# Patient Record
Sex: Female | Born: 1975 | Race: White | Hispanic: No | Marital: Married | Smoking: Never smoker
Health system: Southern US, Community
[De-identification: ages and names within clinical notes are randomized; demographics above are authoritative.]

---

## 2019-04-14 ENCOUNTER — Other Ambulatory Visit: Payer: Self-pay

## 2019-04-14 ENCOUNTER — Emergency Department (HOSPITAL_COMMUNITY)
Admission: EM | Admit: 2019-04-14 | Discharge: 2019-04-15 | Disposition: A | Discharge status: Home / Self Care | Payer: BC Managed Care – PPO | Attending: Emergency Medicine | Admitting: Emergency Medicine

## 2019-04-14 ENCOUNTER — Encounter (HOSPITAL_COMMUNITY): Payer: Self-pay | Admitting: Emergency Medicine

## 2019-04-14 DIAGNOSIS — R109 Unspecified abdominal pain: Secondary | ICD-10-CM

## 2019-04-14 DIAGNOSIS — R1032 Left lower quadrant pain: Secondary | ICD-10-CM | POA: Insufficient documentation

## 2019-04-14 LAB — CBC WITH DIFFERENTIAL/PLATELET
Abs Immature Granulocytes: 0.02 10*3/uL (ref 0.00–0.07)
Basophils Absolute: 0.1 10*3/uL (ref 0.0–0.1)
Basophils Relative: 1 %
Eosinophils Absolute: 0.1 10*3/uL (ref 0.0–0.5)
Eosinophils Relative: 2 %
HCT: 40.1 % (ref 36.0–46.0)
Hemoglobin: 13.5 g/dL (ref 12.0–15.0)
Immature Granulocytes: 0 %
Lymphocytes Relative: 21 %
Lymphs Abs: 1.9 10*3/uL (ref 0.7–4.0)
MCH: 32.3 pg (ref 26.0–34.0)
MCHC: 33.7 g/dL (ref 30.0–36.0)
MCV: 95.9 fL (ref 80.0–100.0)
Monocytes Absolute: 0.6 10*3/uL (ref 0.1–1.0)
Monocytes Relative: 7 %
Neutro Abs: 6.4 10*3/uL (ref 1.7–7.7)
Neutrophils Relative %: 69 %
Platelets: 326 10*3/uL (ref 150–400)
RBC: 4.18 MIL/uL (ref 3.87–5.11)
RDW: 11.6 % (ref 11.5–15.5)
WBC: 9.1 10*3/uL (ref 4.0–10.5)
nRBC: 0 % (ref 0.0–0.2)

## 2019-04-14 LAB — BASIC METABOLIC PANEL
Anion gap: 11 (ref 5–15)
BUN: 12 mg/dL (ref 6–20)
CO2: 22 mmol/L (ref 22–32)
Calcium: 9.1 mg/dL (ref 8.9–10.3)
Chloride: 104 mmol/L (ref 98–111)
Creatinine, Ser: 1.05 mg/dL — ABNORMAL HIGH (ref 0.44–1.00)
GFR calc Af Amer: >60 mL/min (ref >60)
GFR calc non Af Amer: >60 mL/min (ref >60)
Glucose, Bld: 237 mg/dL — ABNORMAL HIGH (ref 70–99)
Potassium: 3.7 mmol/L (ref 3.5–5.1)
Sodium: 137 mmol/L (ref 135–145)

## 2019-04-14 LAB — I-STAT BETA HCG BLOOD, ED (MC, WL, AP ONLY): I-stat hCG, quantitative: <5 m[IU]/mL (ref <5)

## 2019-04-14 NOTE — ED Triage Notes (Signed)
Patient reports left back pain for 2 weeks , denies injury , no hematuria or dysuria . Pain increases with movement . No fever or chills .

## 2019-04-15 ENCOUNTER — Emergency Department (HOSPITAL_COMMUNITY): Payer: BC Managed Care – PPO

## 2019-04-15 LAB — URINALYSIS, ROUTINE W REFLEX MICROSCOPIC
Bilirubin Urine: NEGATIVE
Glucose, UA: 50 mg/dL — AB
Ketones, ur: NEGATIVE mg/dL
Nitrite: NEGATIVE
Protein, ur: NEGATIVE mg/dL
Specific Gravity, Urine: 1.019 (ref 1.005–1.030)
pH: 5 (ref 5.0–8.0)

## 2019-04-15 MED ORDER — METHOCARBAMOL 500 MG PO TABS: 500.0000 mg | ORAL_TABLET | Freq: Two times a day (BID) | ORAL | 0 refills | Status: AC

## 2019-04-15 MED ORDER — DOCUSATE SODIUM 100 MG PO CAPS: 100.0000 mg | ORAL_CAPSULE | Freq: Two times a day (BID) | ORAL | 0 refills | Status: AC

## 2019-04-15 NOTE — ED Provider Notes (Signed)
[facility] Provider Note   CSN: [id number] Arrival date & time: 04/14/19  1953     History   Chief Complaint Chief Complaint  Patient presents with  . Back Pain    HPI Tammy Bradshaw is a 43 y.o. female.  43 year old female presenting to the ED with left-sided flank pain.  States this has been a nagging pain for about 2 weeks.  Pain localized to the left flank but does seem to radiate a little bit to her left upper abdomen.  She denies any known injury, trauma, or falls.  Pain is worse with movement.  She has not had any chest pain or shortness of breath.  He denies any dysuria or hematuria but does report urine has had a foul odor.  She denies any history of frequent UTIs.  Also reports her menstrual cycle is 2 weeks late.  She is s/p tubal ligation, not currently on OCP's and husband has had hysterectomy.  She has not tried any medications at home for her symptoms.  She does report history of sciatic disease in the past but this feels very different.  Has not had any numbness or weakness of her legs.  No bowel or bladder incontinence.  The history is provided by the patient and medical records.  Back Pain      History reviewed. No pertinent past medical history.  There are no active problems to display for this patient.   History reviewed. No pertinent surgical history.   OB History   No obstetric history on file.      Home Medications    Prior to Admission medications   Not on File    Family History No family history on file.  Social History Social History   Tobacco Use  . Smoking status: Never Smoker  . Smokeless tobacco: Never Used  Substance Use Topics  . Alcohol use: Yes  . Drug use: Never     Allergies   Patient has no known allergies.   Review of Systems Review of Systems  Musculoskeletal: Positive for back pain.  All other systems reviewed and are negative.    Physical Exam Updated Vital Signs BP  118/70 (BP Location: Right Arm)   Pulse 70   Temp 98.4 F (36.9 C) (Oral)   Resp 16   LMP 02/20/2019 (LMP Unknown)   SpO2 99%   Physical Exam Vitals signs and nursing note reviewed.  Constitutional:      Appearance: She is well-developed.  HENT:     Head: Normocephalic and atraumatic.  Eyes:     Conjunctiva/sclera: Conjunctivae normal.     Pupils: Pupils are equal, round, and reactive to light.  Neck:     Musculoskeletal: Normal range of motion.  Cardiovascular:     Rate and Rhythm: Normal rate and regular rhythm.     Heart sounds: Normal heart sounds.  Pulmonary:     Effort: Pulmonary effort is normal.     Breath sounds: Normal breath sounds.  Abdominal:     General: Bowel sounds are normal.     Palpations: Abdomen is soft.     Tenderness: There is left CVA tenderness.     Comments: Left CVA tenderness that tracks into the left lower thoracic musculature, no deformities or bruising noted, no rash  Musculoskeletal: Normal range of motion.     Comments: Normal strength and sensation in both legs, normal gait  Skin:    General: Skin is warm and dry.  Neurological:  Mental Status: She is alert and oriented to person, place, and time.      ED Treatments / Results  Labs (all labs ordered are listed, but only abnormal results are displayed) Labs Reviewed  BASIC METABOLIC PANEL - Abnormal; Notable for the following components:      Result Value   Glucose, Bld 237 (*)    Creatinine, Ser 1.05 (*)    All other components within normal limits  URINALYSIS, ROUTINE W REFLEX MICROSCOPIC - Abnormal; Notable for the following components:   Glucose, UA 50 (*)    Hgb urine dipstick SMALL (*)    Leukocytes,Ua MODERATE (*)    Bacteria, UA RARE (*)    All other components within normal limits  CBC WITH DIFFERENTIAL/PLATELET  I-STAT BETA HCG BLOOD, ED ([facility], [facility], [facility] ONLY)    EKG None  Radiology Ct Renal Stone Study  Result Date: 04/15/2019 CLINICAL DATA:  Flank pain.  Left-sided flank pain. Back pain x2 weeks. EXAM: CT ABDOMEN AND PELVIS WITHOUT CONTRAST TECHNIQUE: Multidetector CT imaging of the abdomen and pelvis was performed following the standard protocol without IV contrast. COMPARISON:  None. FINDINGS: Lower chest: The lung bases are clear. The heart size is normal. Hepatobiliary: The liver is normal. Normal gallbladder.There is no biliary ductal dilation. Pancreas: Normal contours without ductal dilatation. No peripancreatic fluid collection. Spleen: No splenic laceration or hematoma. Adrenals/Urinary Tract: --Adrenal glands: No adrenal hemorrhage. --Right kidney/ureter: No hydronephrosis or perinephric hematoma. --Left kidney/ureter: No hydronephrosis or perinephric hematoma. --Urinary bladder: Unremarkable. Stomach/Bowel: --Stomach/Duodenum: No hiatal hernia or other gastric abnormality. Normal duodenal course and caliber. --Small bowel: No dilatation or inflammation. --Colon: There is a moderate to large amount of stool throughout the colon. --Appendix: Normal. Vascular/Lymphatic: Normal course and caliber of the major abdominal vessels. --No retroperitoneal lymphadenopathy. --No mesenteric lymphadenopathy. --No pelvic or inguinal lymphadenopathy. Reproductive: Unremarkable Other: No ascites or free air. The abdominal wall is normal. Musculoskeletal. No acute displaced fractures. IMPRESSION: 1. No acute abdominopelvic abnormality. 2. Moderate to large amount of stool throughout the colon. Electronically Signed   By: Katherine Mantle M.D.   On: 04/15/2019 05:42    Procedures Procedures (including critical care time)  Medications Ordered in ED Medications - No data to display   Initial Impression / Assessment and Plan / ED Course  I have reviewed the triage vital signs and the nursing notes.  Pertinent labs & imaging results that were available during my care of the patient were reviewed by me and considered in my medical decision making (see chart for  details).  43 y.o. female presenting to the ED with left flank pain.  This has been ongoing for about 2 weeks.  Also reports some foul-smelling urine but denies any dysuria or hematuria.  She is afebrile and nontoxic.  Does have some pain of the left CVA region that seems to track into the left lower thoracic paraspinal musculature.  This is reproducible.  No focal deficits, ambulatory with steady gait.  She is not having any chest pain, shortness of breath.  No tachycardia or hypoxia to suggest PE and no risk factors for such.  Labs overall reassuring.  UA does have moderate leuks and blood noted but rare bacteria, 0-5 white cells.  Patient will be sent for CT renal study.  Renal stone study without any acute findings.  She does have moderate to large stool burden.  Patient reports normal frequency of bowel movements, but may be smaller in caliber than normal.  Question if she  is experiencing referred pain from her abdomen versus musculoskeletal.  Will start on muscle relaxer and advised to use Colace to see if this will help.  She will need close follow-up with her primary care doctor.  Return here for any new or acute changes.  Final Clinical Impressions(s) / ED Diagnoses   Final diagnoses:  Left flank pain    ED Discharge Orders         Ordered    methocarbamol (ROBAXIN) 500 MG tablet  2 times daily     04/15/19 0625    docusate sodium (COLACE) 100 MG capsule  Every 12 hours     04/15/19 0625           Garlon HatchetSanders, Lisa M, PA-C 04/15/19 [id number]    Nira Connardama, Pedro Eduardo, MD 04/15/19 [phone]

## 2019-04-15 NOTE — ED Notes (Signed)
Patient transported to CT 

## 2019-04-15 NOTE — Discharge Instructions (Signed)
CT scan today did show some stool burden, may want to try a stool softener. Can try taking robaxin to see if it will help back, heating pad, warm soaks, etc may help as well. Follow-up with your primary care doctor. Return here for any new/acute changes.

## 2019-04-15 NOTE — ED Notes (Signed)
ED Provider at bedside. 

## 2020-07-24 IMAGING — CT CT RENAL STONE PROTOCOL
2 of 4 series · 16 of 46 positions shown, 18 images · non-contrast
Comparison: None.

CLINICAL DATA: Flank pain. Left-sided flank pain. Back pain x2
weeks.

EXAM:
CT ABDOMEN AND PELVIS WITHOUT CONTRAST
TECHNIQUE: Multidetector CT imaging of the abdomen and pelvis was performed
following the standard protocol without IV contrast.

[Series 3: renal stone 5.0 · axial · 0.84mm/px · z∈[+542,+957]mm · 13 of 91 slices shown, 15 images]
[im 4/91  soft-tissue]
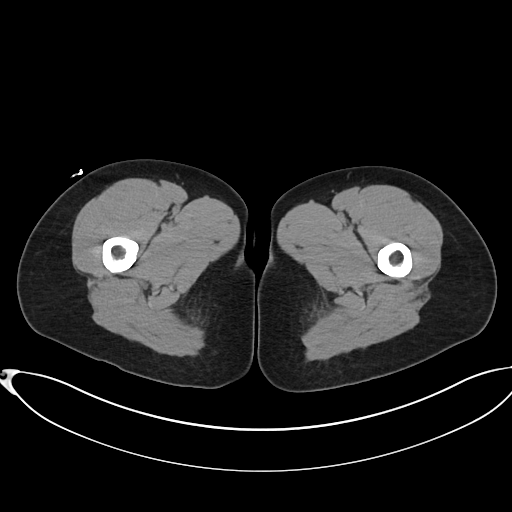
[im 4/91  bone]
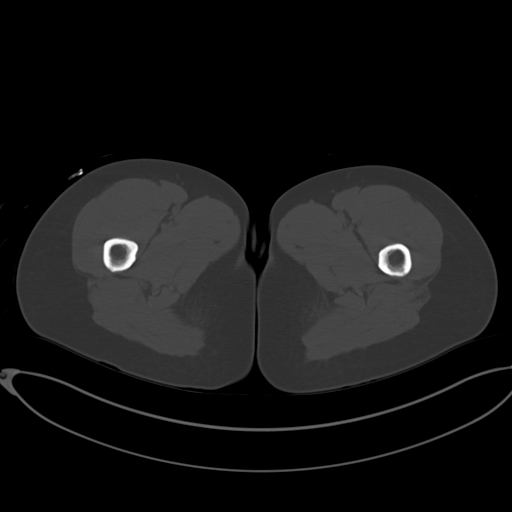
[im 11/91  soft-tissue]
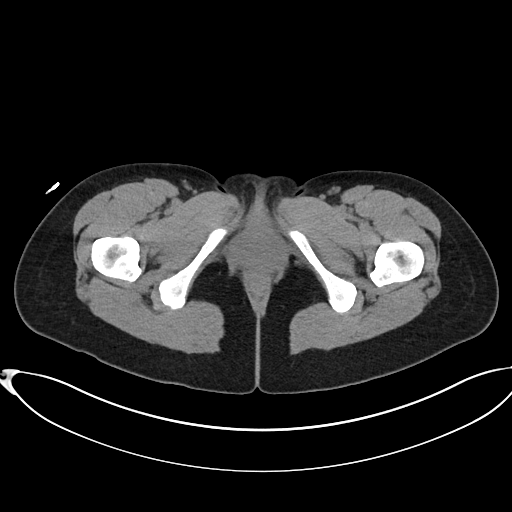
[im 19/91  soft-tissue]
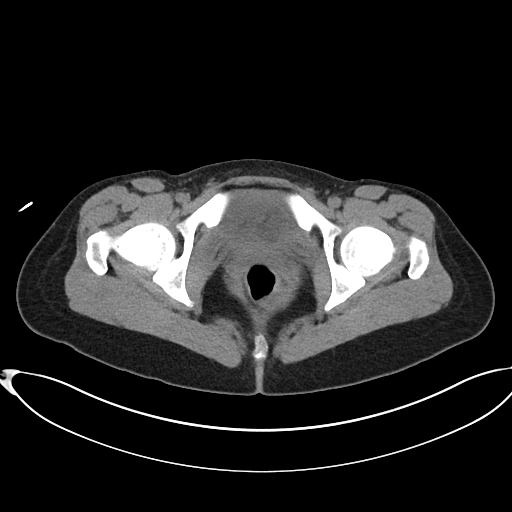
[im 26/91  soft-tissue]
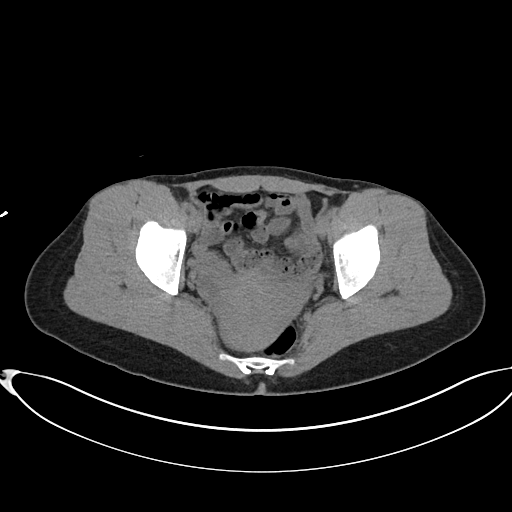
[im 33/91  soft-tissue]
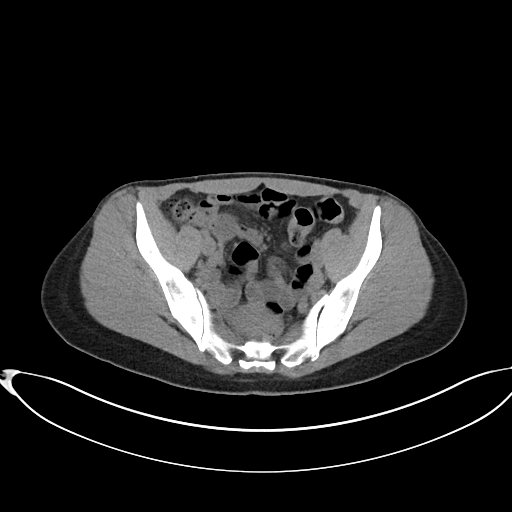
[im 40/91  soft-tissue]
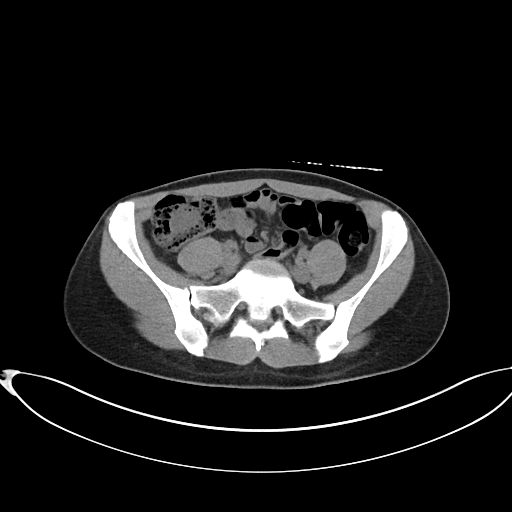
[im 47/91  soft-tissue]
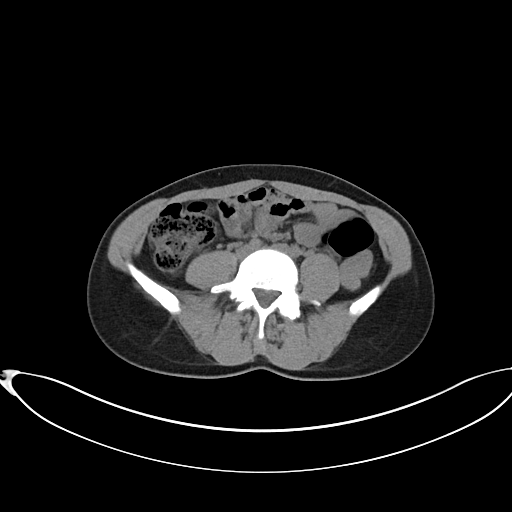
[im 51/91  soft-tissue]
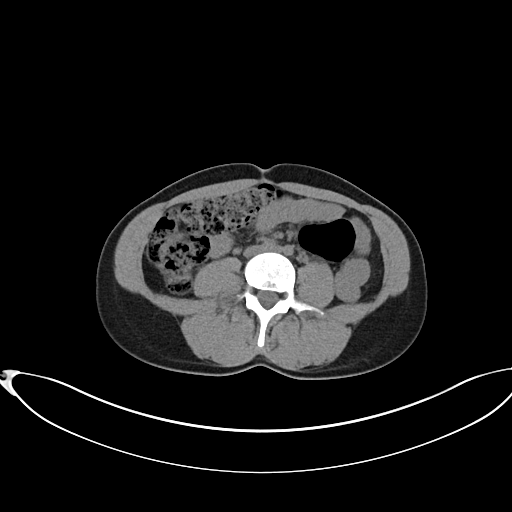
[im 58/91  soft-tissue]
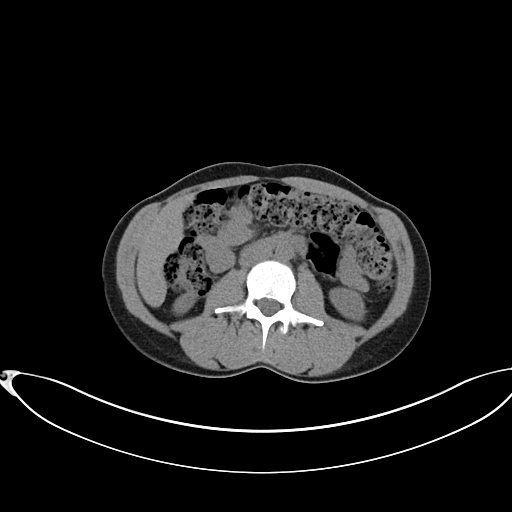
[im 58/91  bone]
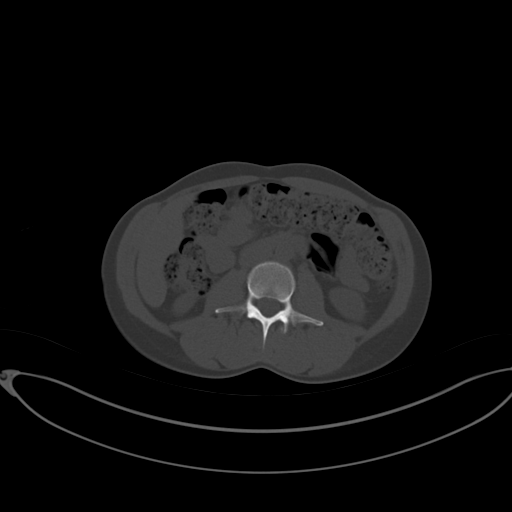
[im 65/91  soft-tissue]
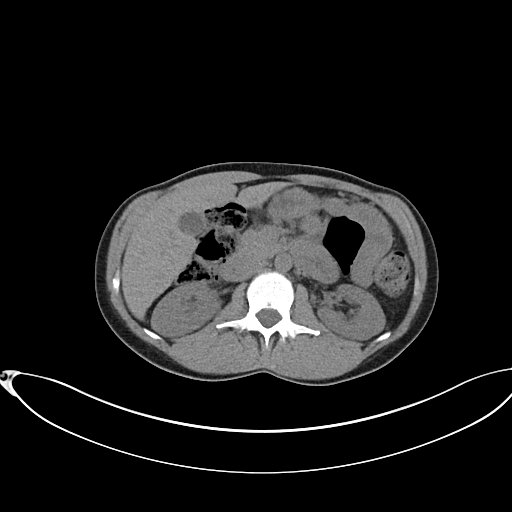
[im 73/91  soft-tissue]
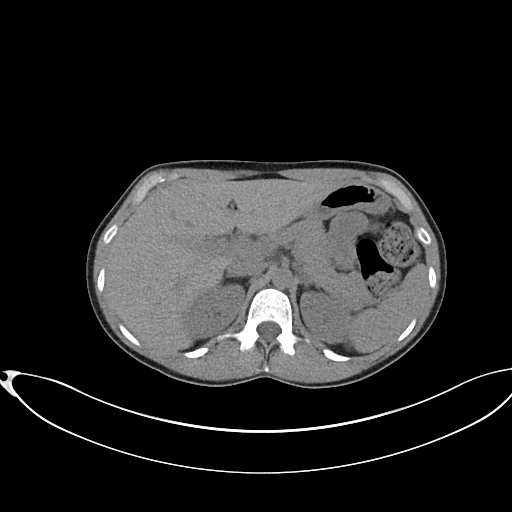
[im 80/91  soft-tissue]
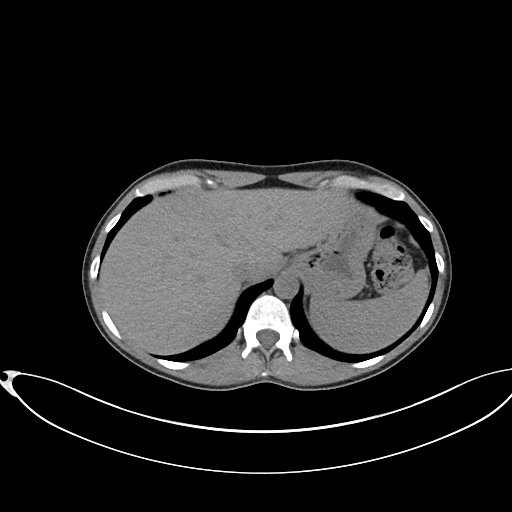
[im 87/91  soft-tissue]
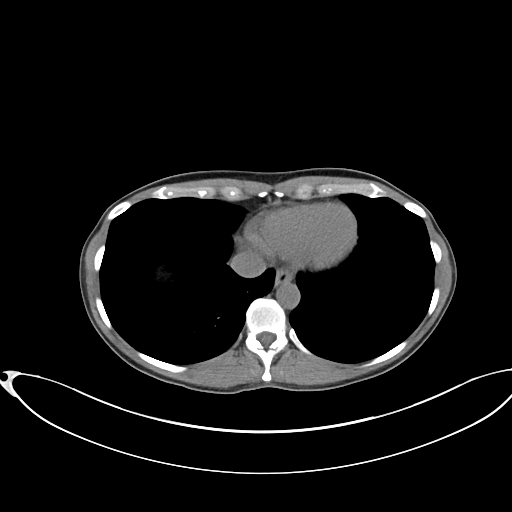

[Series 5: renal stone 3.0 cor · coronal · 0.66mm/px · 3 of 101 slices shown]
[im 34/101  soft-tissue]
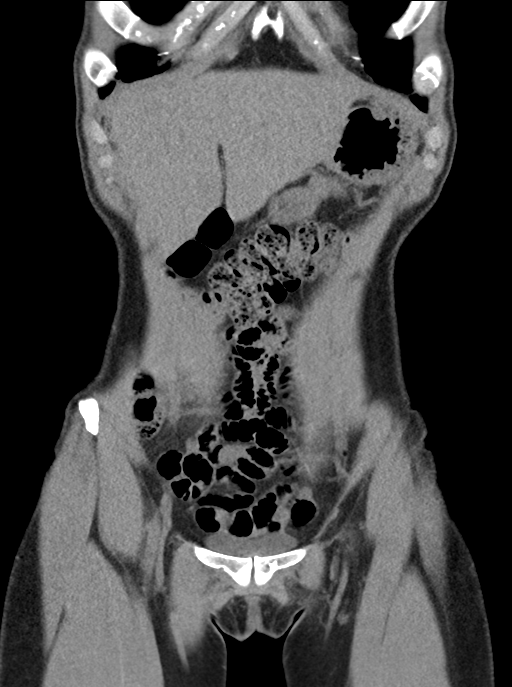
[im 45/101  soft-tissue]
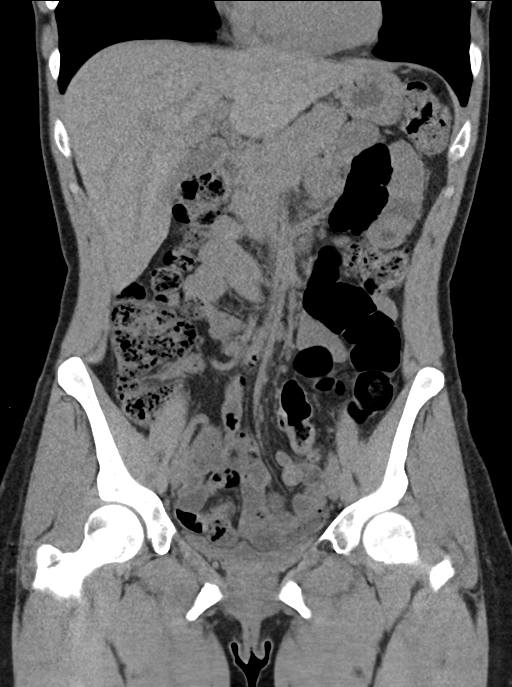
[im 56/101  soft-tissue]
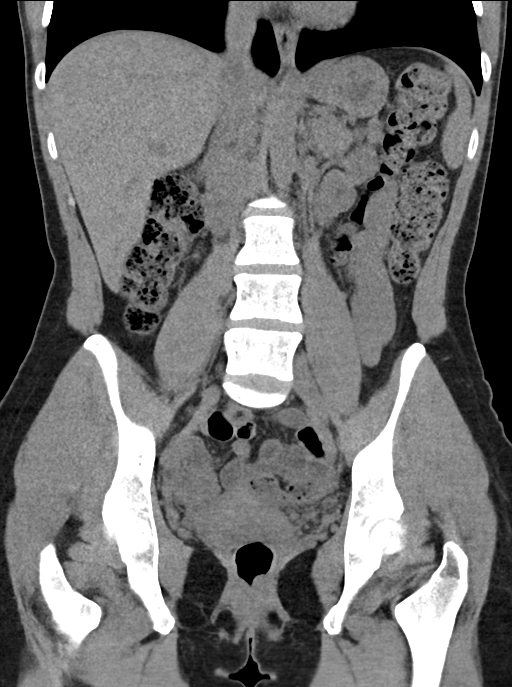

[16 of 46 positions shown; findings below may reference images not displayed]

FINDINGS: Lower chest: The lung bases are clear. The heart size is normal.

Hepatobiliary: The liver is normal. Normal gallbladder.There is no
biliary ductal dilation.

Pancreas: Normal contours without ductal dilatation. No
peripancreatic fluid collection.

Spleen: No splenic laceration or hematoma.

Adrenals/Urinary Tract:

--Adrenal glands: No adrenal hemorrhage.

--Right kidney/ureter: No hydronephrosis or perinephric hematoma.

--Left kidney/ureter: No hydronephrosis or perinephric hematoma.

--Urinary bladder: Unremarkable.

Stomach/Bowel:

--Stomach/Duodenum: No hiatal hernia or other gastric abnormality.
Normal duodenal course and caliber.

--Small bowel: No dilatation or inflammation.

--Colon: There is a moderate to large amount of stool throughout the
colon.

--Appendix: Normal.

Vascular/Lymphatic: Normal course and caliber of the major abdominal
vessels.

--No retroperitoneal lymphadenopathy.

--No mesenteric lymphadenopathy.

--No pelvic or inguinal lymphadenopathy.

Reproductive: Unremarkable

Other: No ascites or free air. The abdominal wall is normal.

Musculoskeletal. No acute displaced fractures.
IMPRESSION: 1. No acute abdominopelvic abnormality.
2. Moderate to large amount of stool throughout the colon.
